# Patient Record
Sex: Female | Born: 1990 | Race: White | Hispanic: No | Marital: Single | Smoking: Never smoker
Health system: Southern US, Community
[De-identification: ages and names within clinical notes are randomized; demographics above are authoritative.]

## PROBLEM LIST (undated history)

## (undated) DIAGNOSIS — F419 Anxiety disorder, unspecified: Secondary | ICD-10-CM

## (undated) DIAGNOSIS — T7840XA Allergy, unspecified, initial encounter: Secondary | ICD-10-CM

## (undated) DIAGNOSIS — G7000 Myasthenia gravis without (acute) exacerbation: Secondary | ICD-10-CM

## (undated) DIAGNOSIS — D649 Anemia, unspecified: Secondary | ICD-10-CM

## (undated) HISTORY — PX: FRACTURE SURGERY: SHX138

## (undated) HISTORY — DX: Anemia, unspecified: D64.9

## (undated) HISTORY — DX: Allergy, unspecified, initial encounter: T78.40XA

## (undated) HISTORY — DX: Anxiety disorder, unspecified: F41.9

## (undated) HISTORY — DX: Myasthenia gravis without (acute) exacerbation: G70.00

---
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
## 2010-01-13 ENCOUNTER — Encounter: Payer: Self-pay | Admitting: Pediatrics

## 2010-01-13 ENCOUNTER — Emergency Department (HOSPITAL_COMMUNITY): Admission: EM | Admit: 2010-01-13 | Discharge: 2010-01-13 | Payer: Self-pay | Admitting: Emergency Medicine

## 2010-01-13 ENCOUNTER — Encounter (HOSPITAL_COMMUNITY)
Admission: RE | Admit: 2010-01-13 | Discharge: 2010-04-13 | Payer: Self-pay | Source: Home / Self Care | Admitting: Pediatrics

## 2010-01-18 ENCOUNTER — Emergency Department (HOSPITAL_COMMUNITY): Admission: EM | Admit: 2010-01-18 | Discharge: 2010-01-19 | Payer: Self-pay | Admitting: Emergency Medicine

## 2010-03-21 ENCOUNTER — Ambulatory Visit (HOSPITAL_COMMUNITY): Admission: RE | Admit: 2010-03-21 | Discharge: 2010-03-21 | Payer: Self-pay | Admitting: Pediatrics

## 2010-05-02 ENCOUNTER — Encounter (HOSPITAL_COMMUNITY)
Admission: RE | Admit: 2010-05-02 | Discharge: 2010-06-13 | Payer: Self-pay | Source: Home / Self Care | Attending: Pediatrics | Admitting: Pediatrics

## 2010-06-13 NOTE — Progress Notes (Signed)
Summary: Pt wants to know if it is ok to go hiking.  ---- Converted from flag ---- ---- 12/07/2009 3:44 PM, Marily Memos wrote: Pt wants to know if she can hike instead of running? Her Contact # [phone]. ------------------------------  Spoke with Wylene Men.  Advised pt per Dr. Darrick Penna that if she goes hiking to make sure she wears her ankle brace and not to over do it.  Pt. states understanding.  Terese Door, CMA

## 2010-06-13 NOTE — Consult Note (Signed)
Summary: [facility]  [facility]   Imported By: Marily Memos 12/27/2009 15:59:25  _____________________________________________________________________  External Attachment:    Type:   Image     Comment:   External Document

## 2010-06-13 NOTE — Assessment & Plan Note (Signed)
Summary: ANKLE INJURY/MJD   Vital Signs:  Patient profile:   21 year old female Height:      66 inches Weight:      160.50 pounds BMI:     26.00 Pulse rate:   71 / minute BP sitting:   118 / 78  (left arm)  Vitals Entered By: Terese Door (December 07, 2009 8:45 AM) CC: RIGHT ANKLE INJURY-TWISTED PLAYING SOCCER 1 WEEK AGO   CC:  RIGHT ANKLE INJURY-TWISTED PLAYING SOCCER 1 WEEK AGO.  History of Present Illness: 20 yo F 1 wk turned RT ankle playing soccer inverted swelled a lot for few days put on an old ankle brace 5 days ago and this helped some icing some  this ankle had been hurt last April as well We saw this on Dec 30 and found a small avulsion fx  this is 3rd injury in past 8 mos  Allergies: No Known Drug Allergies  Physical Exam  General:  Well-developed,well-nourished,in no acute distress; alert,appropriate and cooperative throughout examination Msk:  Left ankle shows no swelling; stable lateral and medial ligaments; squeeze test and kleiger test unremarkable; talar dome seems nontender; no sign of peroneal tendon subluxations; no pain at base of 5th MT. note there is generalized increased laxity   RT ankle shows mild lateral swelling; stable lateral and medial ligaments but some general laxity; squeeze test and kleiger test unremarkable; talar dome seems nontender; no sign of peroneal tendon subluxations; no pain at base of 5th MT. no TTP fibular head. There is mild direct TTP at distal tip of lat malleolus  Can walk/ walk on toes and heels and stand on 1 foot without too much pain or limping     Impression & Recommendations:  Problem # 1:  ANKLE PAIN, RIGHT (ICD-719.47) Pain is less w bracing encourage more icing keep rigid brace for 1 more week and then move to lace up  Problem # 2:  ANKLE SPRAIN, RIGHT (ICD-845.00) This is becoming a recurrent prob given ankle rehab exercises work these daily and progress them to better balance exercises  use lace  up braces for all running and sports for next year  reck 4 wks unless resolved sxs no running for 2 more wks but X Train is OK  Complete Medication List: 1)  Proventil Hfa 108 (90 Base) Mcg/act Aers (Albuterol sulfate) .Marland Kitchen.. 1 puff 20 min before exercise 2)  Singulair 10 Mg Tabs (Montelukast sodium) .Marland Kitchen.. 1 tab by mouth daily

## 2010-07-24 LAB — COMPREHENSIVE METABOLIC PANEL
ALT: 20 U/L (ref 0–35)
AST: 28 U/L (ref 0–37)
Alkaline Phosphatase: 36 U/L — ABNORMAL LOW (ref 39–117)
CO2: 23 mEq/L (ref 19–32)
GFR calc Af Amer: >60 mL/min (ref >60)
Glucose, Bld: 133 mg/dL — ABNORMAL HIGH (ref 70–99)
Potassium: 3.7 mEq/L (ref 3.5–5.1)
Sodium: 139 mEq/L (ref 135–145)
Total Protein: 6.9 g/dL (ref 6.0–8.3)

## 2010-07-24 LAB — DIFFERENTIAL
Basophils Relative: 0 % (ref 0–1)
Lymphs Abs: 0.8 10*3/uL (ref 0.7–4.0)
Monocytes Relative: 2 % — ABNORMAL LOW (ref 3–12)
Neutro Abs: 9.4 10*3/uL — ABNORMAL HIGH (ref 1.7–7.7)
Neutrophils Relative %: 91 % — ABNORMAL HIGH (ref 43–77)

## 2010-07-24 LAB — TYPE AND SCREEN: Antibody Screen: NEGATIVE

## 2010-07-24 LAB — PROTIME-INR: INR: 1.01 (ref 0.00–1.49)

## 2010-07-24 LAB — FIBRINOGEN: Fibrinogen: 208 mg/dL (ref 204–475)

## 2010-07-24 LAB — CBC
Hemoglobin: 11.5 g/dL — ABNORMAL LOW (ref 12.0–15.0)
RBC: 3.98 MIL/uL (ref 3.87–5.11)

## 2010-07-25 LAB — DIFFERENTIAL
Basophils Absolute: 0 10*3/uL (ref 0.0–0.1)
Eosinophils Absolute: 0 10*3/uL (ref 0.0–0.7)
Eosinophils Relative: 0 % (ref 0–5)
Lymphocytes Relative: 9 % — ABNORMAL LOW (ref 12–46)
Monocytes Absolute: 0.3 10*3/uL (ref 0.1–1.0)

## 2010-07-25 LAB — TYPE AND SCREEN: ABO/RH(D): A POS

## 2010-07-25 LAB — COMPREHENSIVE METABOLIC PANEL
ALT: 30 U/L (ref 0–35)
AST: 25 U/L (ref 0–37)
Albumin: 4 g/dL (ref 3.5–5.2)
Alkaline Phosphatase: 49 U/L (ref 39–117)
CO2: 23 mEq/L (ref 19–32)
Chloride: 105 mEq/L (ref 96–112)
GFR calc Af Amer: >60 mL/min (ref >60)
GFR calc non Af Amer: >60 mL/min (ref >60)
Potassium: 3.8 mEq/L (ref 3.5–5.1)
Sodium: 137 mEq/L (ref 135–145)
Total Bilirubin: 0.4 mg/dL (ref 0.3–1.2)

## 2010-07-25 LAB — CBC
Hemoglobin: 11.5 g/dL — ABNORMAL LOW (ref 12.0–15.0)
Hemoglobin: 12.4 g/dL (ref 12.0–15.0)
MCH: 30.4 pg (ref 26.0–34.0)
MCH: 30.9 pg (ref 26.0–34.0)
MCHC: 33.9 g/dL (ref 30.0–36.0)
Platelets: 206 10*3/uL (ref 150–400)
Platelets: 289 10*3/uL (ref 150–400)
RBC: 3.78 MIL/uL — ABNORMAL LOW (ref 3.87–5.11)
RDW: 12 % (ref 11.5–15.5)
WBC: 8.9 10*3/uL (ref 4.0–10.5)

## 2010-07-25 LAB — FIBRINOGEN: Fibrinogen: 237 mg/dL (ref 204–475)

## 2010-07-25 LAB — HCG, SERUM, QUALITATIVE: Preg, Serum: NEGATIVE

## 2010-07-25 LAB — APTT: aPTT: 28 seconds (ref 24–37)

## 2010-07-25 LAB — PROTIME-INR
INR: 0.98 (ref 0.00–1.49)
Prothrombin Time: 13.2 seconds (ref 11.6–15.2)

## 2010-07-27 LAB — COMPREHENSIVE METABOLIC PANEL
ALT: 29 U/L (ref 0–35)
ALT: 39 U/L — ABNORMAL HIGH (ref 0–35)
ALT: 52 U/L — ABNORMAL HIGH (ref 0–35)
AST: 29 U/L (ref 0–37)
AST: 38 U/L — ABNORMAL HIGH (ref 0–37)
AST: 53 U/L — ABNORMAL HIGH (ref 0–37)
Albumin: 4 g/dL (ref 3.5–5.2)
Albumin: 4.3 g/dL (ref 3.5–5.2)
Alkaline Phosphatase: 56 U/L (ref 39–117)
Alkaline Phosphatase: 34 U/L — ABNORMAL LOW (ref 39–117)
Alkaline Phosphatase: 36 U/L — ABNORMAL LOW (ref 39–117)
Alkaline Phosphatase: 42 U/L (ref 39–117)
BUN: 9 mg/dL (ref 6–23)
BUN: 10 mg/dL (ref 6–23)
BUN: 7 mg/dL (ref 6–23)
BUN: 8 mg/dL (ref 6–23)
BUN: 9 mg/dL (ref 6–23)
CO2: 26 mEq/L (ref 19–32)
CO2: 26 mEq/L (ref 19–32)
CO2: 26 mEq/L (ref 19–32)
Calcium: 9.2 mg/dL (ref 8.4–10.5)
Calcium: 9.3 mg/dL (ref 8.4–10.5)
Calcium: 9.5 mg/dL (ref 8.4–10.5)
Chloride: 106 mEq/L (ref 96–112)
Chloride: 104 mEq/L (ref 96–112)
Chloride: 107 mEq/L (ref 96–112)
Creatinine, Ser: 0.59 mg/dL (ref 0.4–1.2)
Creatinine, Ser: 0.51 mg/dL (ref 0.4–1.2)
Creatinine, Ser: 0.52 mg/dL (ref 0.4–1.2)
GFR calc Af Amer: >60 mL/min (ref >60)
GFR calc Af Amer: >60 mL/min (ref >60)
GFR calc Af Amer: >60 mL/min (ref >60)
GFR calc non Af Amer: >60 mL/min (ref >60)
GFR calc non Af Amer: >60 mL/min (ref >60)
GFR calc non Af Amer: >60 mL/min (ref >60)
Glucose, Bld: 100 mg/dL — ABNORMAL HIGH (ref 70–99)
Glucose, Bld: 105 mg/dL — ABNORMAL HIGH (ref 70–99)
Glucose, Bld: 98 mg/dL (ref 70–99)
Potassium: 3.8 mEq/L (ref 3.5–5.1)
Potassium: 3.5 mEq/L (ref 3.5–5.1)
Potassium: 3.6 mEq/L (ref 3.5–5.1)
Sodium: 137 mEq/L (ref 135–145)
Sodium: 139 mEq/L (ref 135–145)
Sodium: 139 mEq/L (ref 135–145)
Total Bilirubin: 0.4 mg/dL (ref 0.3–1.2)
Total Bilirubin: 0.8 mg/dL (ref 0.3–1.2)
Total Bilirubin: 1.1 mg/dL (ref 0.3–1.2)
Total Protein: 5.8 g/dL — ABNORMAL LOW (ref 6.0–8.3)
Total Protein: 5.9 g/dL — ABNORMAL LOW (ref 6.0–8.3)
Total Protein: 6.4 g/dL (ref 6.0–8.3)

## 2010-07-27 LAB — CBC
HCT: 31 % — ABNORMAL LOW (ref 36.0–46.0)
HCT: 33.1 % — ABNORMAL LOW (ref 36.0–46.0)
HCT: 33.1 % — ABNORMAL LOW (ref 36.0–46.0)
HCT: 32.7 % — ABNORMAL LOW (ref 36.0–46.0)
HCT: 35.5 % — ABNORMAL LOW (ref 36.0–46.0)
HCT: 36.2 % (ref 36.0–46.0)
HCT: 39.4 % (ref 36.0–46.0)
HCT: 40 % (ref 36.0–46.0)
Hemoglobin: 10.7 g/dL — ABNORMAL LOW (ref 12.0–15.0)
Hemoglobin: 11.2 g/dL — ABNORMAL LOW (ref 12.0–15.0)
Hemoglobin: 11.4 g/dL — ABNORMAL LOW (ref 12.0–15.0)
Hemoglobin: 11.5 g/dL — ABNORMAL LOW (ref 12.0–15.0)
Hemoglobin: 11.5 g/dL — ABNORMAL LOW (ref 12.0–15.0)
Hemoglobin: 11.3 g/dL — ABNORMAL LOW (ref 12.0–15.0)
Hemoglobin: 11.4 g/dL — ABNORMAL LOW (ref 12.0–15.0)
Hemoglobin: 14.2 g/dL (ref 12.0–15.0)
MCH: 33.4 pg (ref 26.0–34.0)
MCH: 33.6 pg (ref 26.0–34.0)
MCH: 33.2 pg (ref 26.0–34.0)
MCH: 33.4 pg (ref 26.0–34.0)
MCH: 34.1 pg — ABNORMAL HIGH (ref 26.0–34.0)
MCH: 34.2 pg — ABNORMAL HIGH (ref 26.0–34.0)
MCHC: 34.5 g/dL (ref 30.0–36.0)
MCHC: 34.5 g/dL (ref 30.0–36.0)
MCHC: 34.7 g/dL (ref 30.0–36.0)
MCHC: 34.9 g/dL (ref 30.0–36.0)
MCHC: 35.1 g/dL (ref 30.0–36.0)
MCHC: 35.2 g/dL (ref 30.0–36.0)
MCHC: 35.5 g/dL (ref 30.0–36.0)
MCV: 97 fL (ref 78.0–100.0)
MCV: 96.2 fL (ref 78.0–100.0)
MCV: 96.2 fL (ref 78.0–100.0)
MCV: 95.2 fL (ref 78.0–100.0)
MCV: 96.7 fL (ref 78.0–100.0)
MCV: 97.3 fL (ref 78.0–100.0)
MCV: 97.6 fL (ref 78.0–100.0)
Platelets: 168 10*3/uL (ref 150–400)
Platelets: 155 10*3/uL (ref 150–400)
Platelets: 169 10*3/uL (ref 150–400)
RBC: 3.18 MIL/uL — ABNORMAL LOW (ref 3.87–5.11)
RBC: 3.35 MIL/uL — ABNORMAL LOW (ref 3.87–5.11)
RBC: 3.44 MIL/uL — ABNORMAL LOW (ref 3.87–5.11)
RBC: 3.44 MIL/uL — ABNORMAL LOW (ref 3.87–5.11)
RBC: 3.46 MIL/uL — ABNORMAL LOW (ref 3.87–5.11)
RBC: 3.71 MIL/uL — ABNORMAL LOW (ref 3.87–5.11)
RBC: 4.14 MIL/uL (ref 3.87–5.11)
RDW: 12 % (ref 11.5–15.5)
RDW: 12 % (ref 11.5–15.5)
RDW: 12.1 % (ref 11.5–15.5)
RDW: 12.2 % (ref 11.5–15.5)
RDW: 12.2 % (ref 11.5–15.5)
WBC: 6.6 10*3/uL (ref 4.0–10.5)
WBC: 6.2 10*3/uL (ref 4.0–10.5)
WBC: 6.4 10*3/uL (ref 4.0–10.5)
WBC: 7.9 10*3/uL (ref 4.0–10.5)

## 2010-07-27 LAB — BASIC METABOLIC PANEL
BUN: 11 mg/dL (ref 6–23)
BUN: 7 mg/dL (ref 6–23)
CO2: 26 mEq/L (ref 19–32)
CO2: 28 mEq/L (ref 19–32)
CO2: 27 mEq/L (ref 19–32)
CO2: 26 mEq/L (ref 19–32)
Calcium: 9.2 mg/dL (ref 8.4–10.5)
Calcium: 9.2 mg/dL (ref 8.4–10.5)
Chloride: 106 mEq/L (ref 96–112)
Chloride: 107 mEq/L (ref 96–112)
Chloride: 106 mEq/L (ref 96–112)
Chloride: 110 mEq/L (ref 96–112)
Creatinine, Ser: 0.62 mg/dL (ref 0.4–1.2)
GFR calc Af Amer: >60 mL/min (ref >60)
GFR calc Af Amer: >60 mL/min (ref >60)
GFR calc non Af Amer: >60 mL/min (ref >60)
GFR calc non Af Amer: >60 mL/min (ref >60)
Glucose, Bld: 103 mg/dL — ABNORMAL HIGH (ref 70–99)
Glucose, Bld: 85 mg/dL (ref 70–99)
Glucose, Bld: 95 mg/dL (ref 70–99)
Glucose, Bld: 97 mg/dL (ref 70–99)
Glucose, Bld: 88 mg/dL (ref 70–99)
Potassium: 3.4 mEq/L — ABNORMAL LOW (ref 3.5–5.1)
Potassium: 3.8 mEq/L (ref 3.5–5.1)
Potassium: 3.7 mEq/L (ref 3.5–5.1)
Potassium: 4.4 mEq/L (ref 3.5–5.1)
Sodium: 138 mEq/L (ref 135–145)
Sodium: 139 mEq/L (ref 135–145)
Sodium: 139 mEq/L (ref 135–145)
Sodium: 141 mEq/L (ref 135–145)

## 2010-07-27 LAB — POCT I-STAT, CHEM 8
Calcium, Ion: 1.2 mmol/L (ref 1.12–1.32)
Glucose, Bld: 91 mg/dL (ref 70–99)
HCT: 37 % (ref 36.0–46.0)
Hemoglobin: 12.6 g/dL (ref 12.0–15.0)
Potassium: 3.7 mEq/L (ref 3.5–5.1)
TCO2: 22 mmol/L (ref 0–100)

## 2010-07-27 LAB — DIFFERENTIAL
Basophils Absolute: 0 10*3/uL (ref 0.0–0.1)
Basophils Absolute: 0 10*3/uL (ref 0.0–0.1)
Basophils Relative: 1 % (ref 0–1)
Basophils Relative: 1 % (ref 0–1)
Basophils Relative: 0 % (ref 0–1)
Eosinophils Absolute: 0.8 10*3/uL — ABNORMAL HIGH (ref 0.0–0.7)
Eosinophils Absolute: 0.3 10*3/uL (ref 0.0–0.7)
Eosinophils Relative: 11 % — ABNORMAL HIGH (ref 0–5)
Eosinophils Relative: 2 % (ref 0–5)
Lymphocytes Relative: 23 % (ref 12–46)
Lymphocytes Relative: 19 % (ref 12–46)
Lymphs Abs: 1.4 10*3/uL (ref 0.7–4.0)
Lymphs Abs: 1.2 10*3/uL (ref 0.7–4.0)
Monocytes Absolute: 0.5 10*3/uL (ref 0.1–1.0)
Monocytes Absolute: 0.6 10*3/uL (ref 0.1–1.0)
Monocytes Absolute: 0.4 10*3/uL (ref 0.1–1.0)
Monocytes Absolute: 0.8 10*3/uL (ref 0.1–1.0)
Monocytes Relative: 8 % (ref 3–12)
Monocytes Relative: 13 % — ABNORMAL HIGH (ref 3–12)
Neutrophils Relative %: 58 % (ref 43–77)
Neutrophils Relative %: 53 % (ref 43–77)

## 2010-07-27 LAB — ABO/RH: ABO/RH(D): A POS

## 2010-07-27 LAB — HEPATITIS C ANTIBODY: HCV Ab: NEGATIVE

## 2010-07-27 LAB — APTT: aPTT: 35 seconds (ref 24–37)

## 2010-07-27 LAB — RETICULOCYTES: Retic Ct Pct: 2.6 % (ref 0.4–3.1)

## 2010-07-27 LAB — PROTIME-INR: Prothrombin Time: 13.1 seconds (ref 11.6–15.2)

## 2010-07-30 ENCOUNTER — Encounter: Payer: Self-pay | Admitting: *Deleted

## 2012-04-20 IMAGING — CR DG CHEST 2V
2 series · 2 of 2 positions shown · non-contrast
Comparison: 05/28/2003.

CLINICAL DATA: Syncopal episode today.  Evaluate aeration.

CHEST - 2 VIEW

[w chest pa]
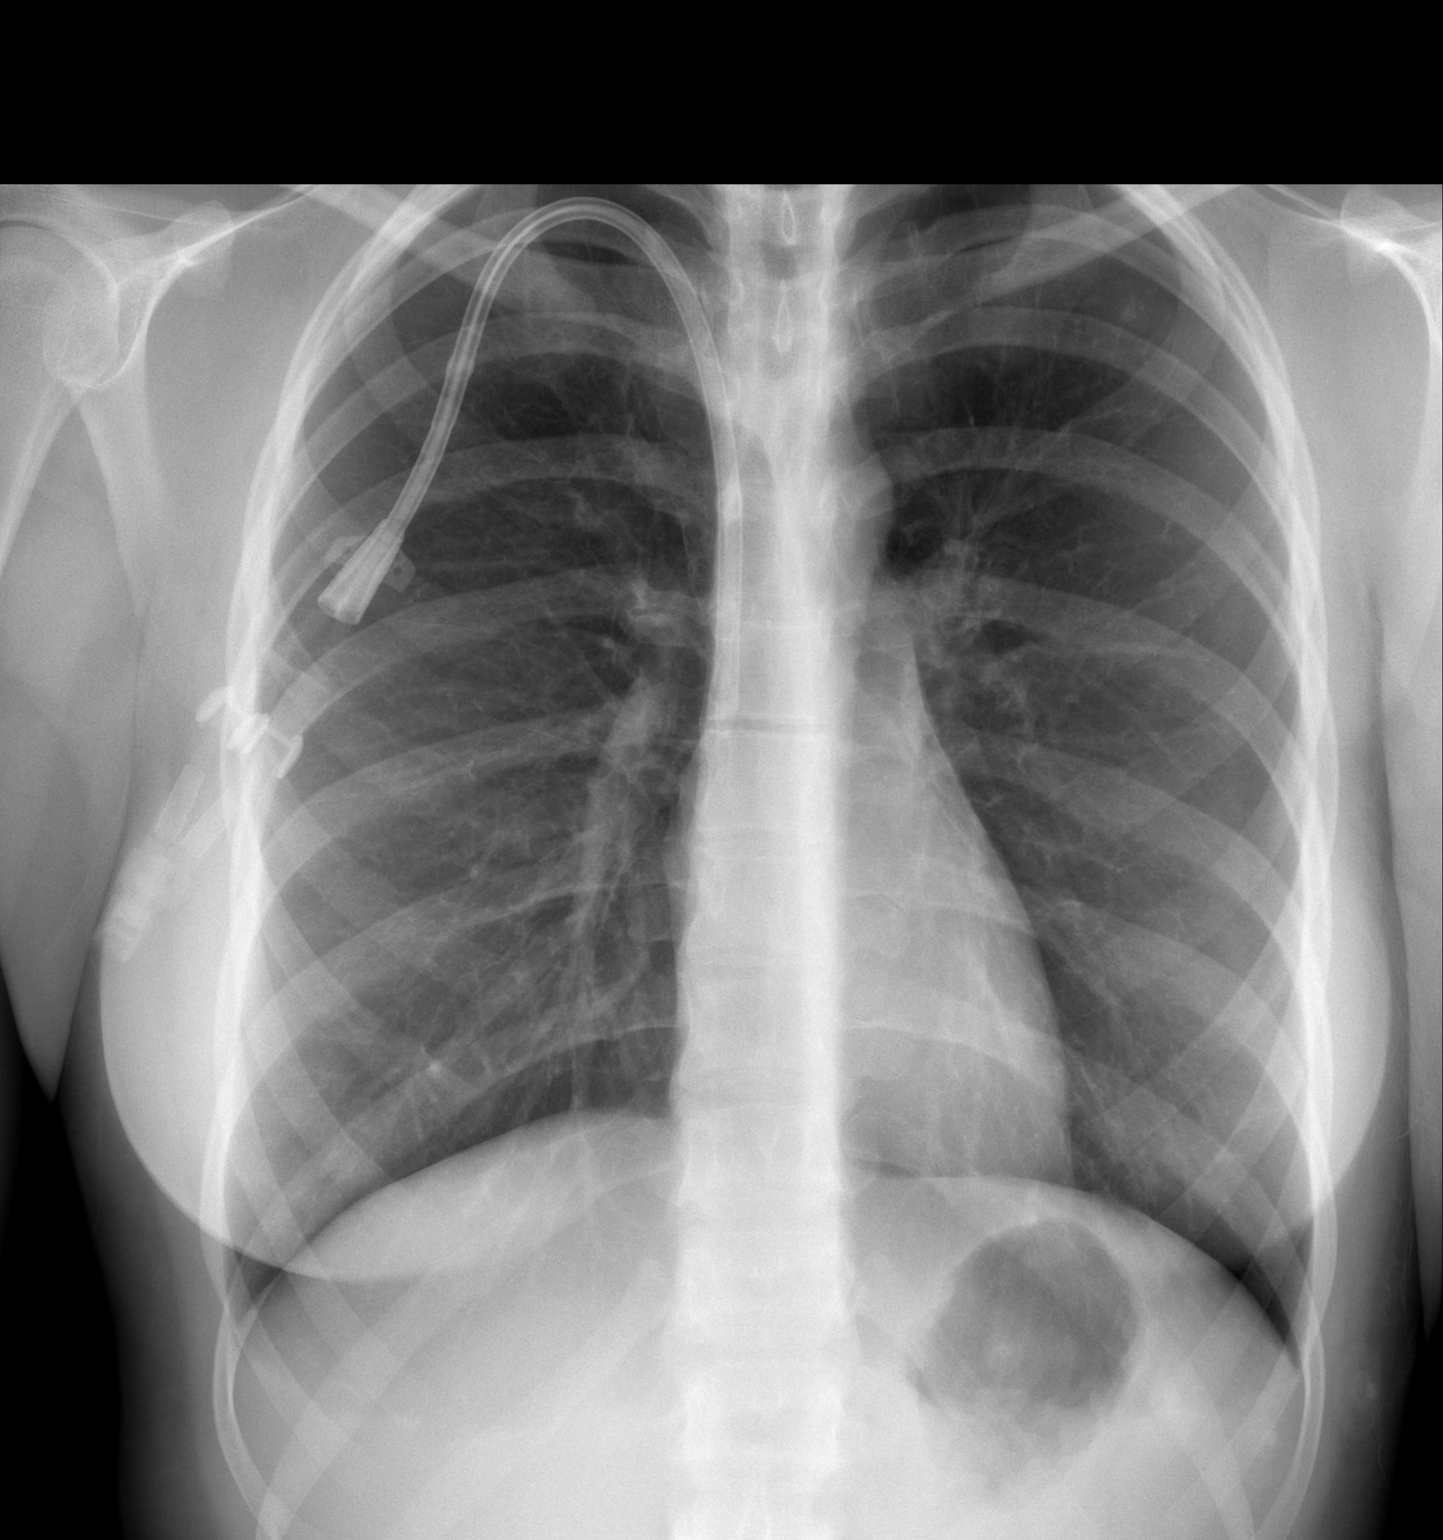

[w chest lat]
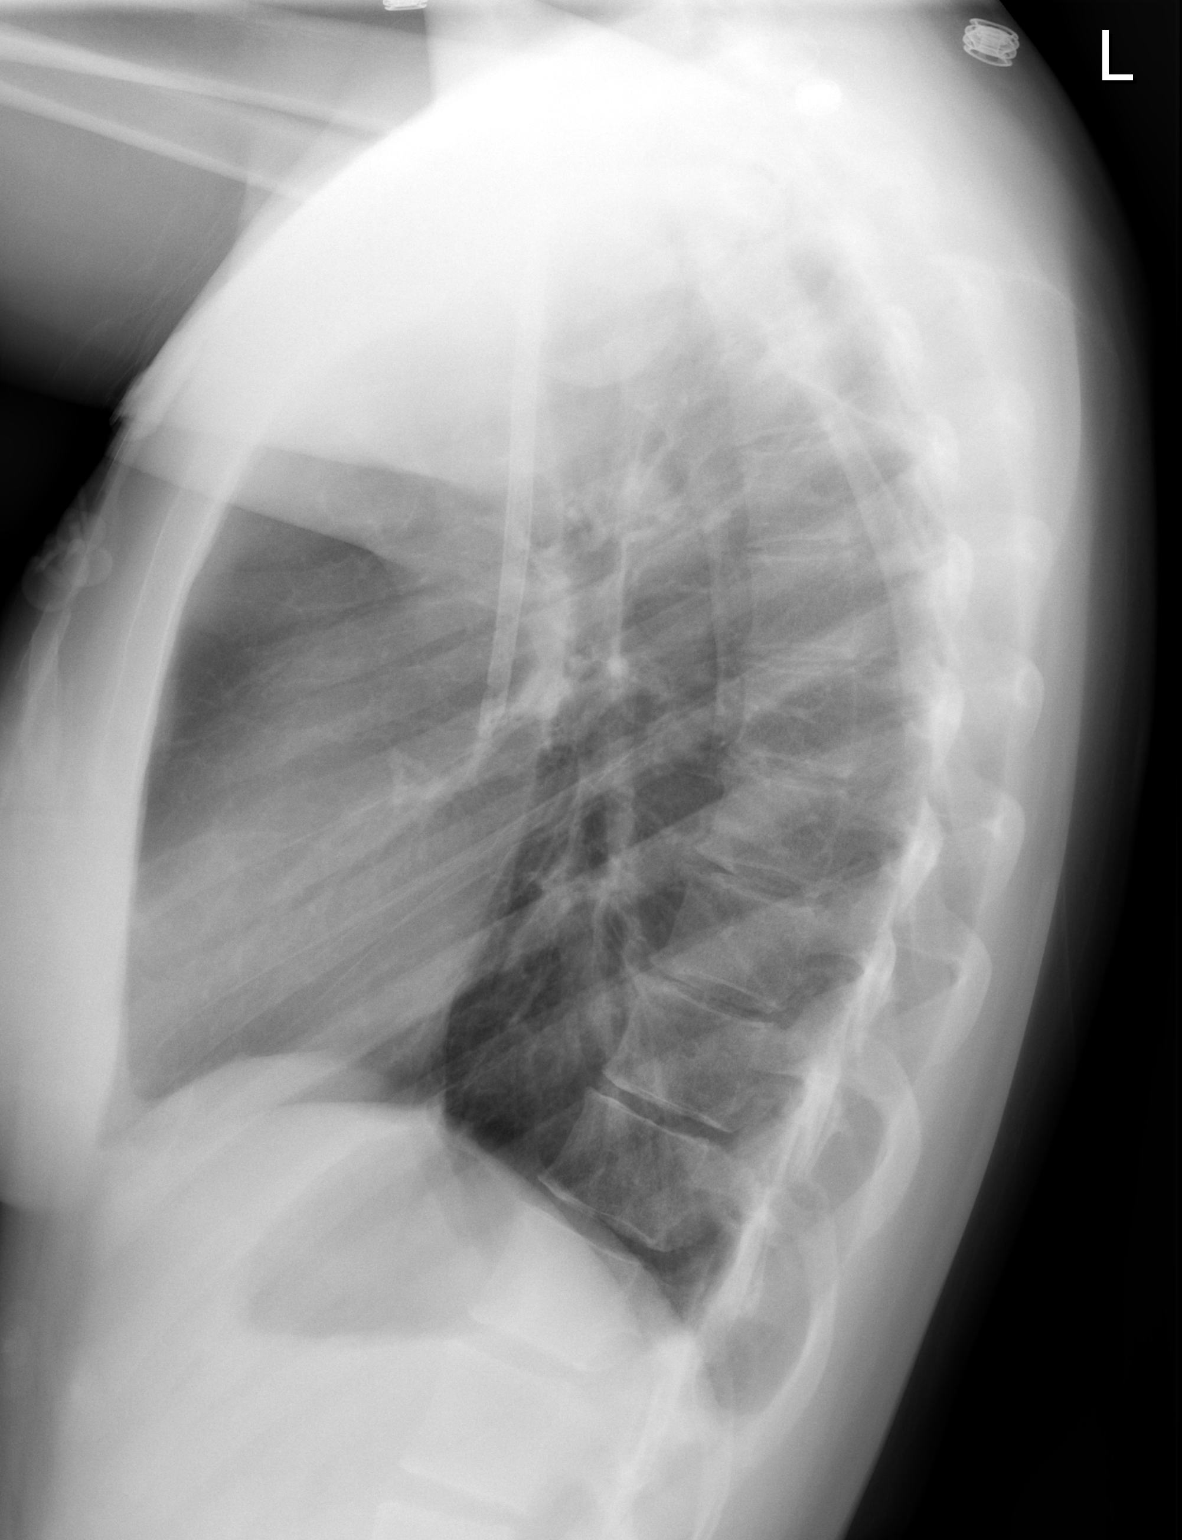

[2 of 2 positions shown; findings below may reference images not displayed]

FINDINGS: The lungs are clear.  The heart and mediastinal
structures are normal.  There is a central venous hemodialysis
catheters present with the tip of the catheter in the region of the
superior vena cava.
IMPRESSION: No evidence for active chest disease.

## 2012-04-20 IMAGING — XA IR FLUORO GUIDE CV LINE*R*
1 series · 2 of 2 positions shown · non-contrast
Comparison: none

CLINICAL DATA: Myasthenia gravis, access for plasma paresis

[Series 1: run · 2 of 2 slices shown]
[im 1/2]
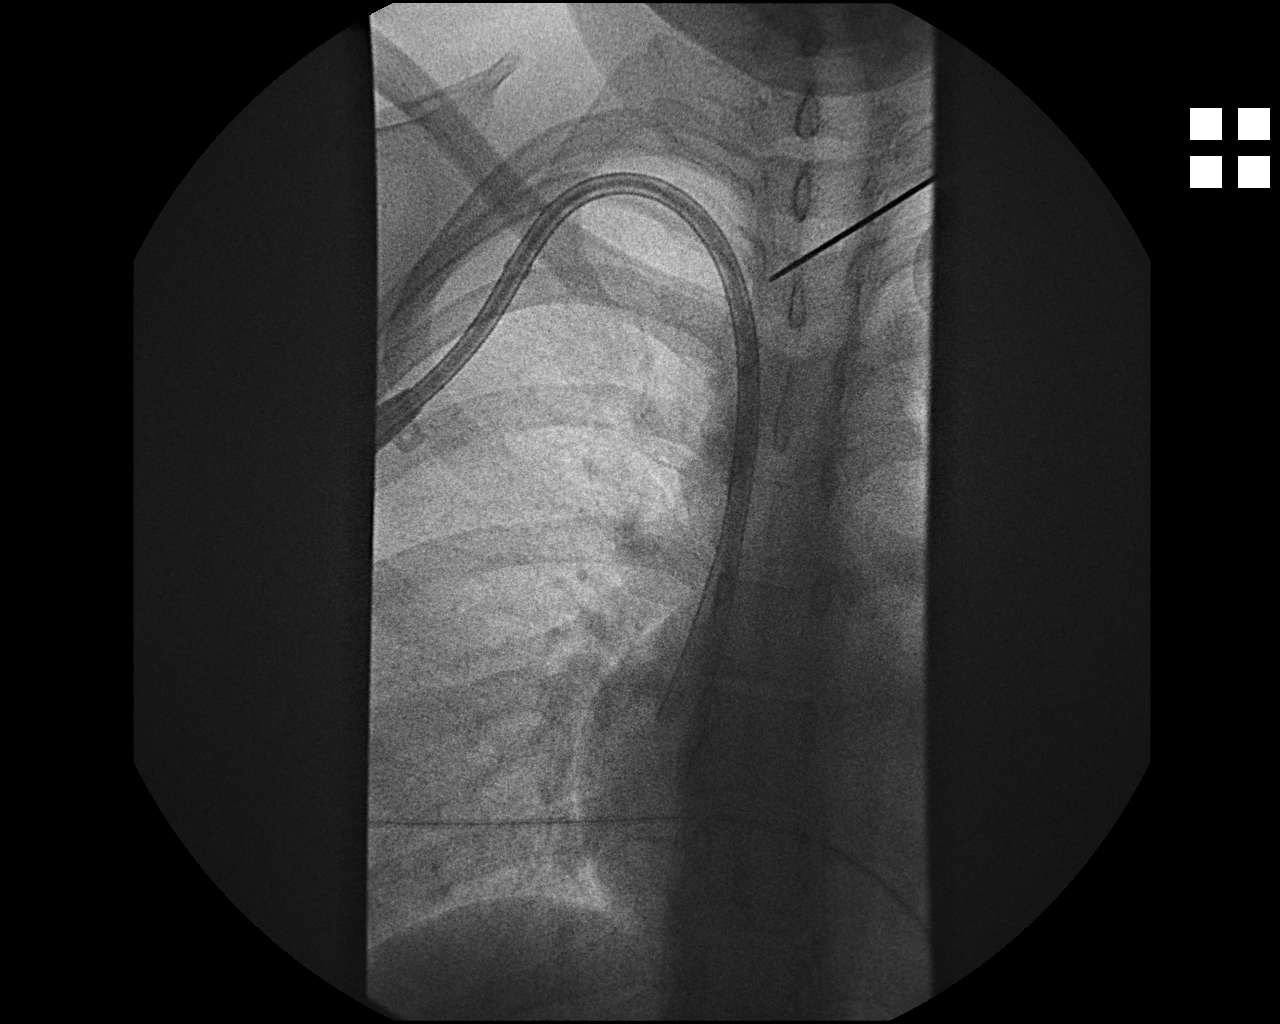
[im 2/2]
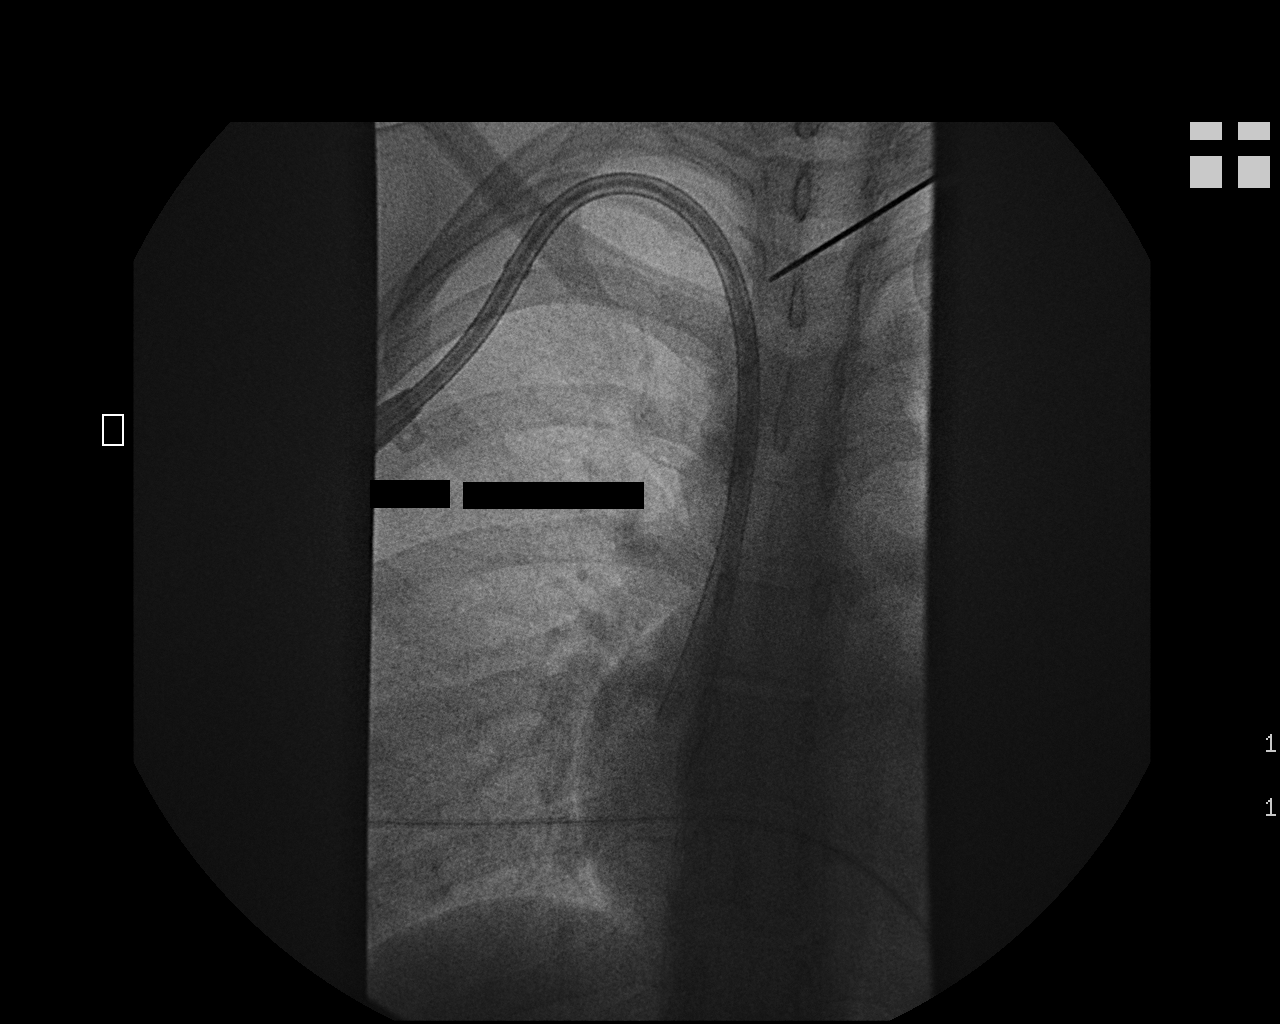

[2 of 2 positions shown; findings below may reference images not displayed]

ULTRASOUND GUIDANCE FOR VASCULAR ACCESS
RIGHT INTERNAL JUGULAR PERMANENT DIALYSIS/PHERESIS CATHETER

Date:  01/13/2010 [DATE]

Radiologist:  Hj Wahab Singgih, M.D.

Medications:  1 gram ancefadministered within 1 hour of the
procedure,2 mg Versed, 50 mcg Fentanyl

Guidance:  Ultrasound and fluoroscopic

Fluoroscopy time:  0.4 minutes

Sedation time:  20 minutes

Contrast volume:  None.

Complications:  No immediate

PROCEDURE/FINDINGS:

Informed consent was obtained from the patient following
explanation of the procedure, risks, benefits and alternatives.
The patient understands, agrees and consents for the procedure.
All questions were addressed.  A time out was performed.

Maximal barrier sterile technique utilized including caps, mask,
sterile gowns, sterile gloves, large sterile drape, hand hygiene,
and 2% chlorhexidine scrub.

Under sterile conditions and local anesthesia, right internal
jugular micropuncture venous access was performed with ultrasound.
Images were obtained for documentation.  A guide wire was inserted
followed by a transitional dilator.  Next, a 0.035 guidewire was
advanced into the IVC with a 5-French catheter.  Measurements were
obtained from the right venotomy site to the proximal right atrium.
In the right infraclavicular chest, a subcutaneous tunnel was
created under sterile conditions and local anesthesia.  1%
lidocaine with epinephrine was utilized for this.  The 19 cm tip to
cuff Equistream catheter was tunneled subcutaneously to the
venotomy site and inserted into the SVC/RA junction through a
valved peel-away sheath.  Position was confirmed with fluoroscopy.
Images were obtained for documentation.  Blood was aspirated from
the catheter followed by saline and heparin flushes.  The
appropriate volume and strength of heparin was instilled in each
lumen.  Caps were applied.  The catheter was secured at the tunnel
site with Gelfoam and a pursestring suture.  The venotomy site was
closed with subcuticular Vicryl suture.  Dermabond was applied to
the small right neck incision.  A dry sterile dressing was applied.
The catheter is ready for use.  No immediate complications.
IMPRESSION: Ultrasound and fluoroscopically guided right internal jugular
tunneled pheresis catheter (19 cm tip to cuff Equistream catheter).

## 2012-04-26 IMAGING — CR DG CHEST 2V
2 series · 2 of 2 positions shown · non-contrast
Comparison: Chest 01/13/2010.

CLINICAL DATA: Neck pain in patient with recent central line
placement.

CHEST - 2 VIEW

[w chest pa]
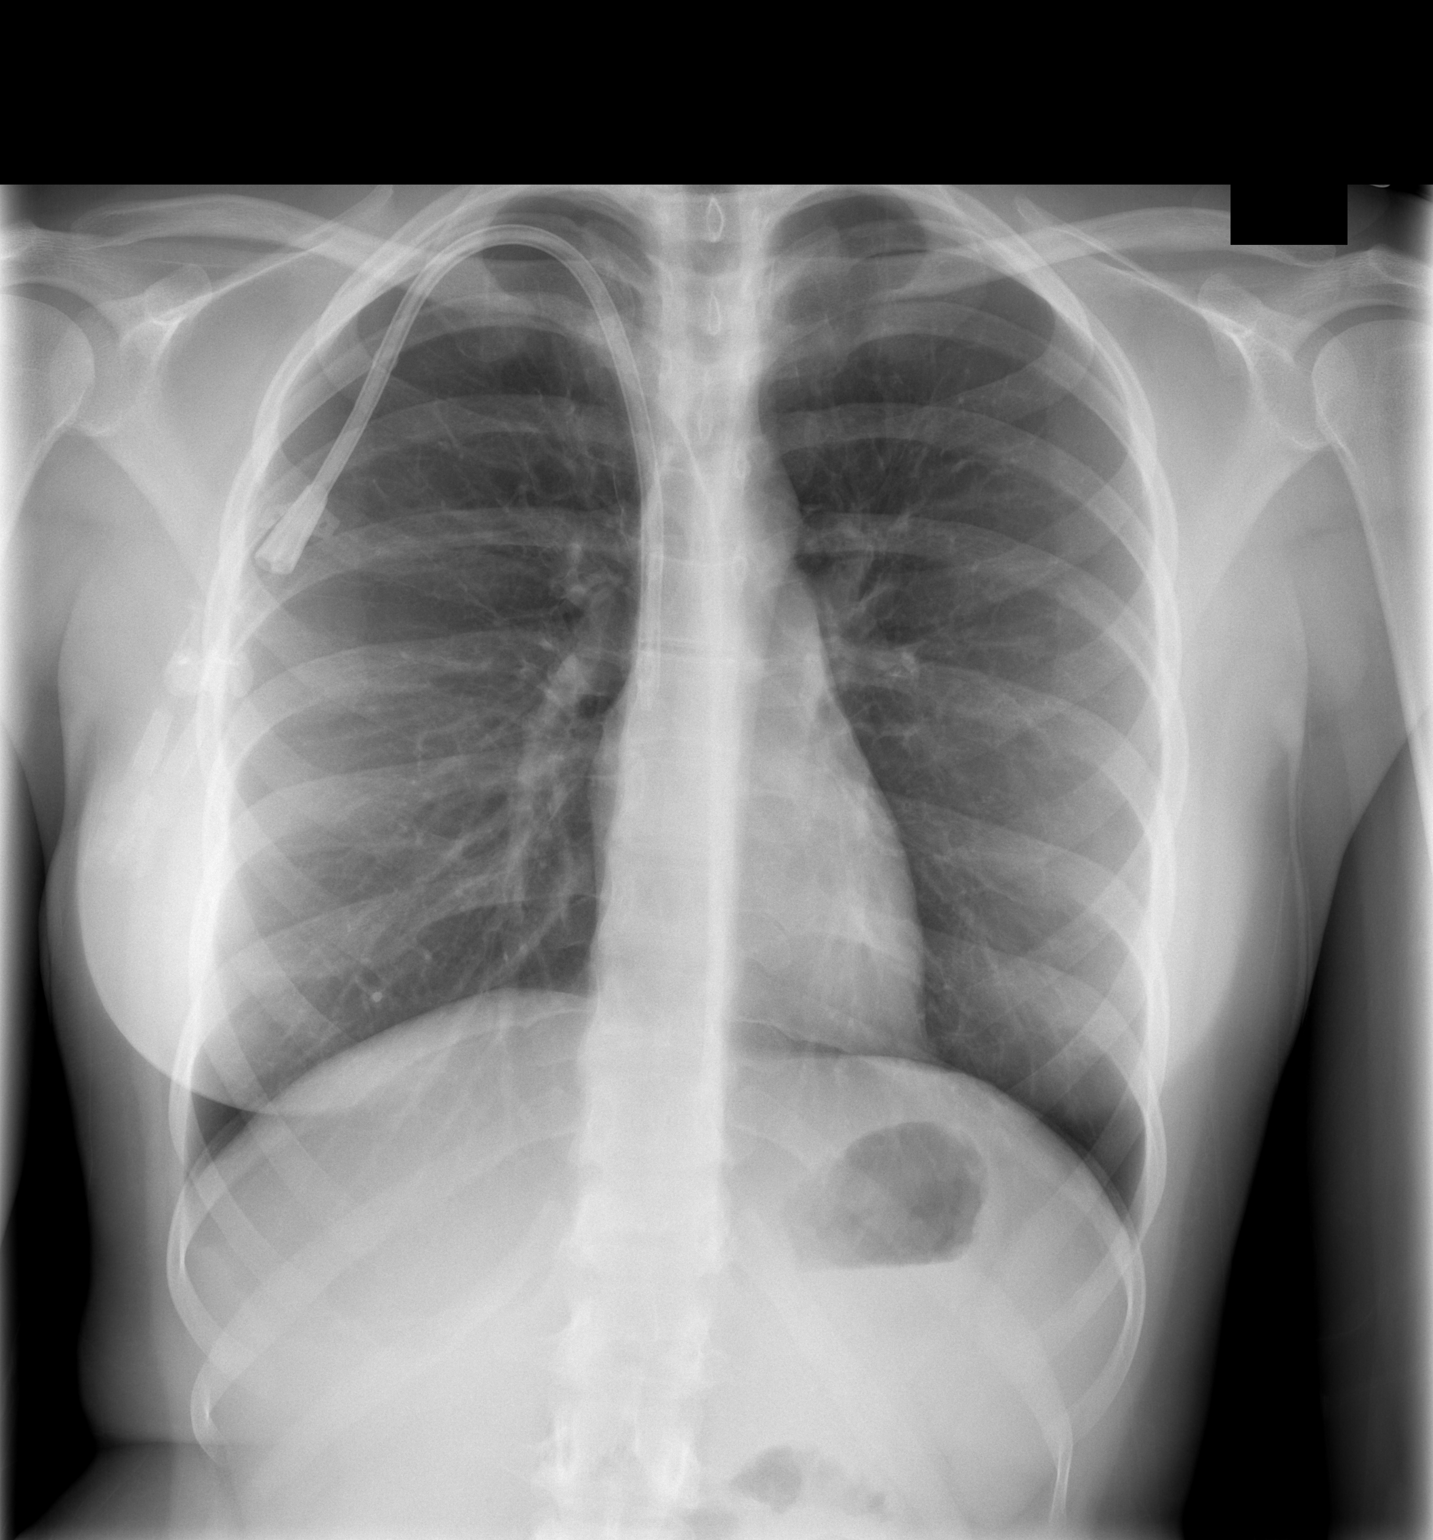

[w chest lat]
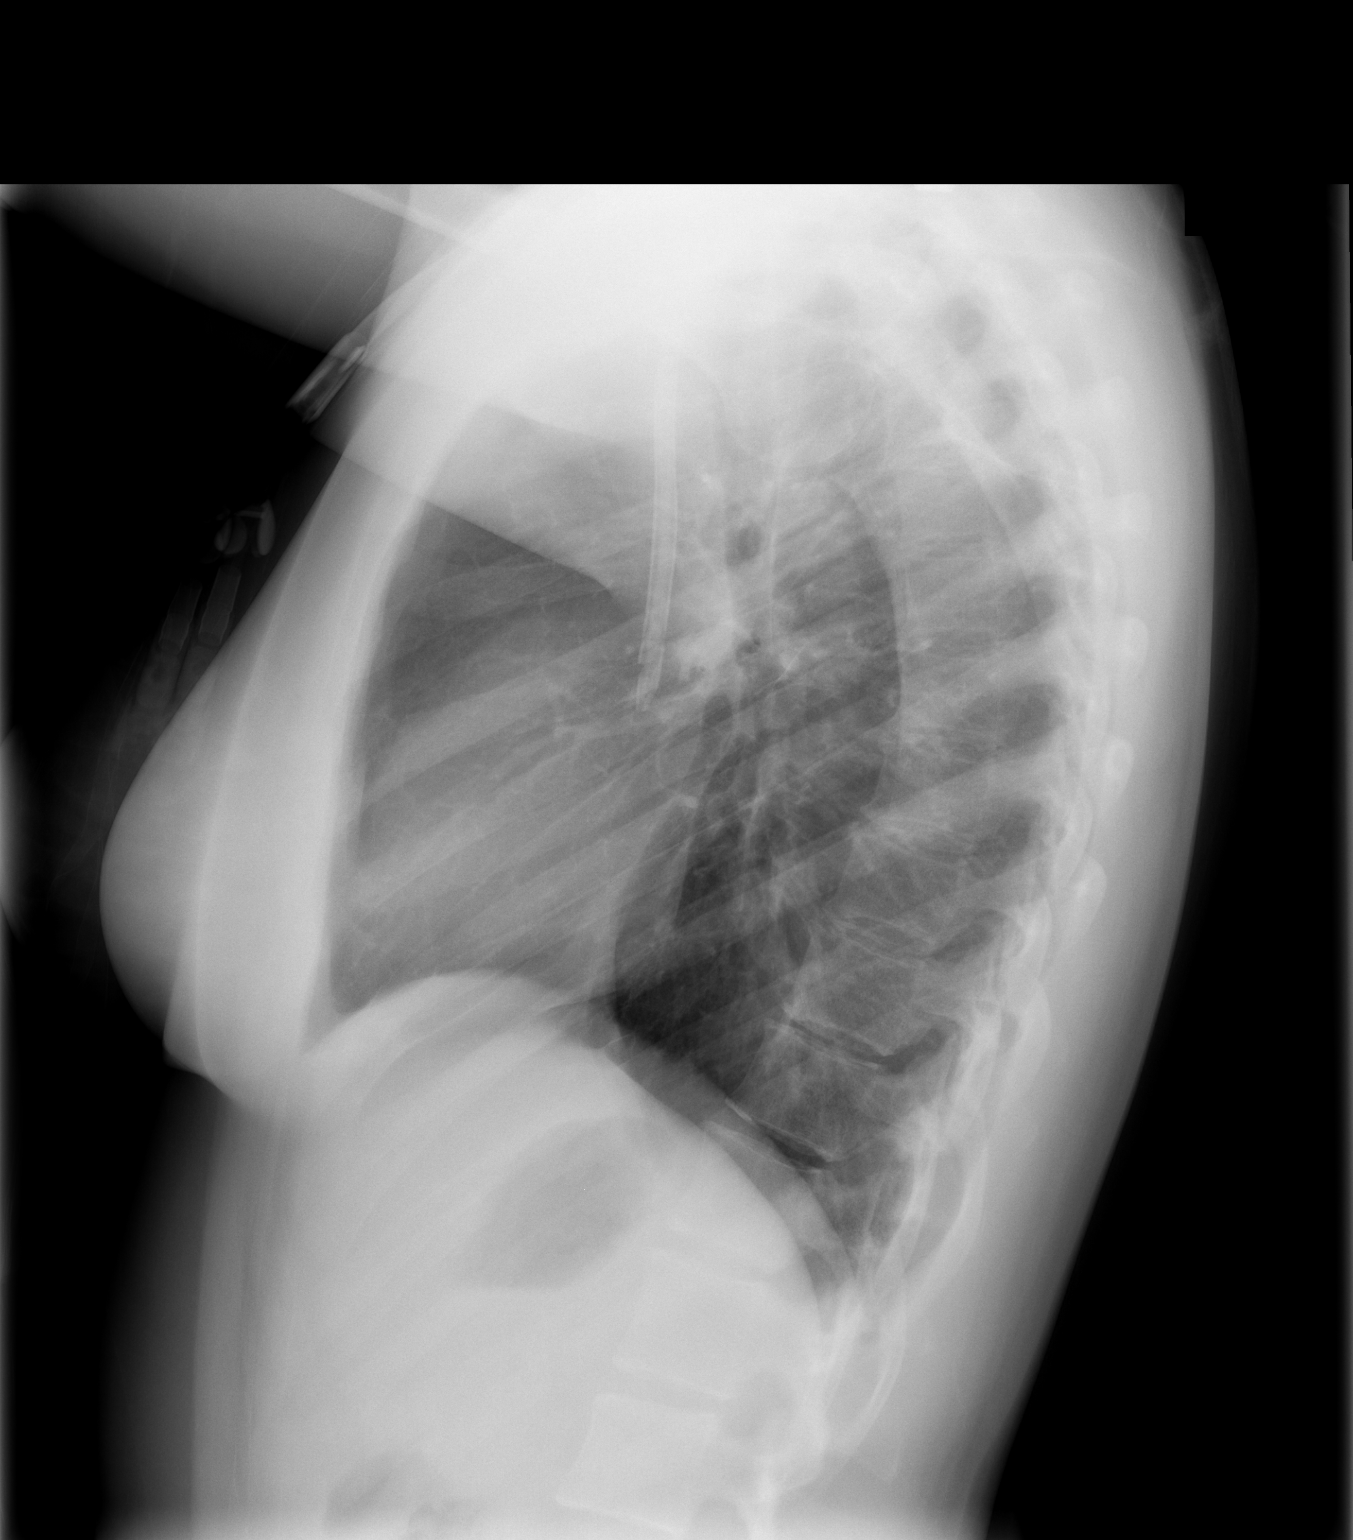

[2 of 2 positions shown; findings below may reference images not displayed]

FINDINGS: Double-lumen central venous catheter is in place with the
tip in the lower superior vena cava, unchanged.  There is no
pneumothorax.  The lungs are clear.  No effusion.  Heart size
normal.  No focal bony abnormality.
IMPRESSION: No acute finding.  Central line in good position without evidence
of complication.

## 2012-04-26 IMAGING — CT CT NECK W/ CM
5 series · 16 of 33 positions shown, 18 images · IV contrast (APPLIED)
Comparison: None.

CLINICAL DATA: Recent central line placement.  Pain about the
central line.

CT NECK WITH CONTRAST
TECHNIQUE: Multidetector CT imaging of the neck was performed with
intravenous contrast.
Contrast: 75 ml 6mnipaque-DHH.

[Series 3: st neck 2.0 b31s · axial · 0.47mm/px · z∈[-308,-128]mm · 4 of 152 slices shown, 5 images]
[im 31/152  soft-tissue]
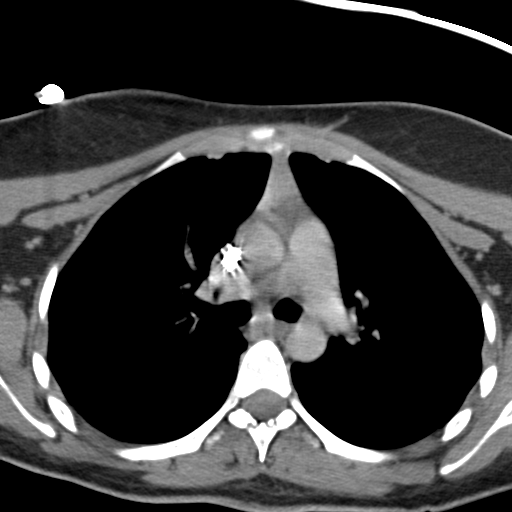
[im 31/152  bone]
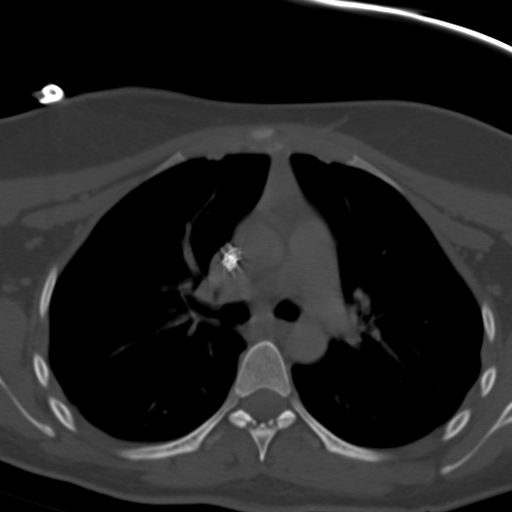
[im 61/152  bone]
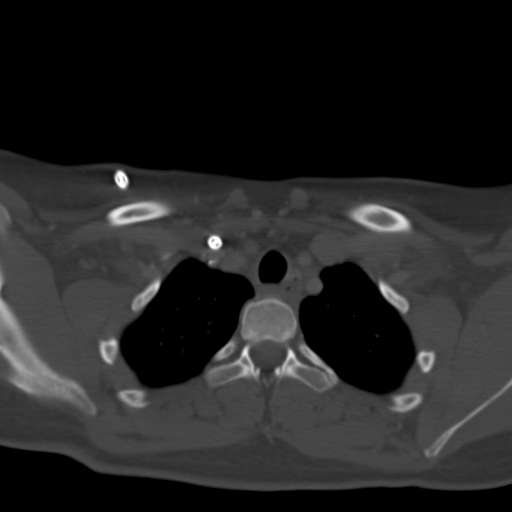
[im 91/152  bone]
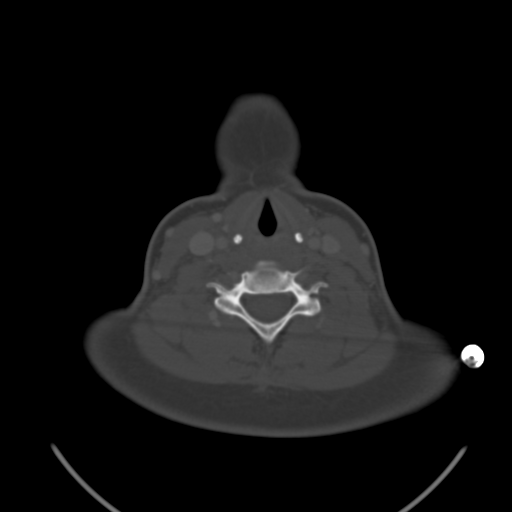
[im 121/152  bone]
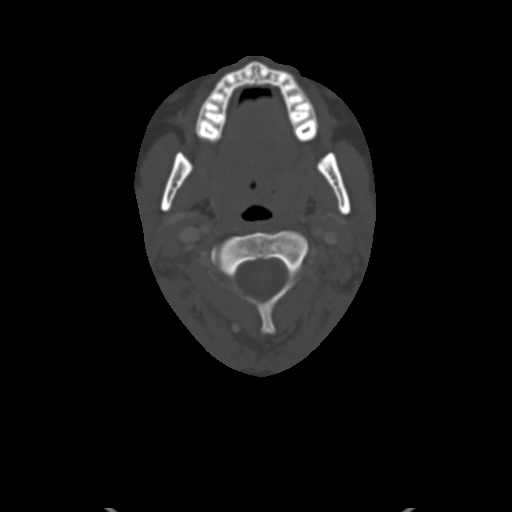

[Series 5: st neck 2.0 b60s · axial · 0.50mm/px · z∈[-313,-257]mm · 2 of 86 slices shown]
[im 29/86  bone]
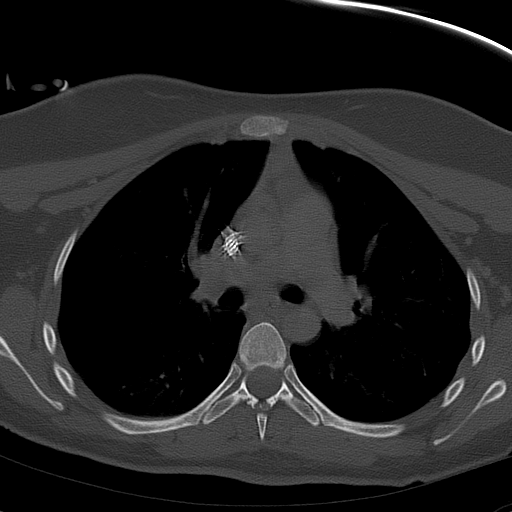
[im 57/86  bone]
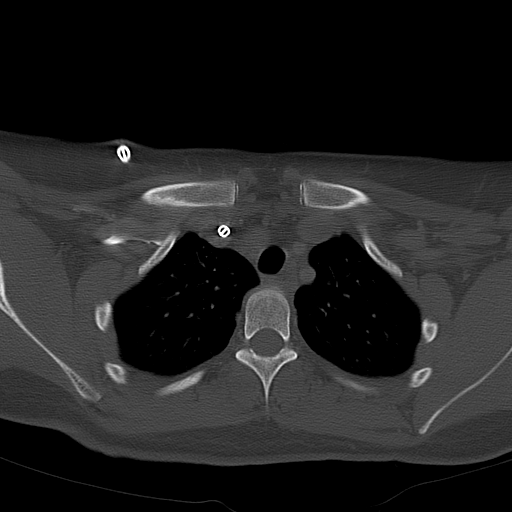

[Series 602: coronals · coronal · 0.59mm/px · 3 of 70 slices shown]
[im 14/70  bone]
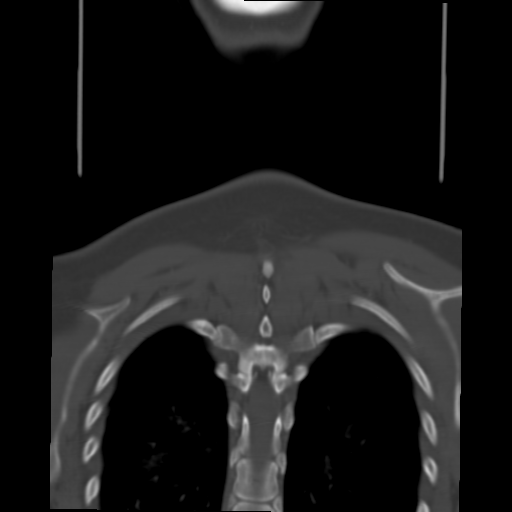
[im 28/70  bone]
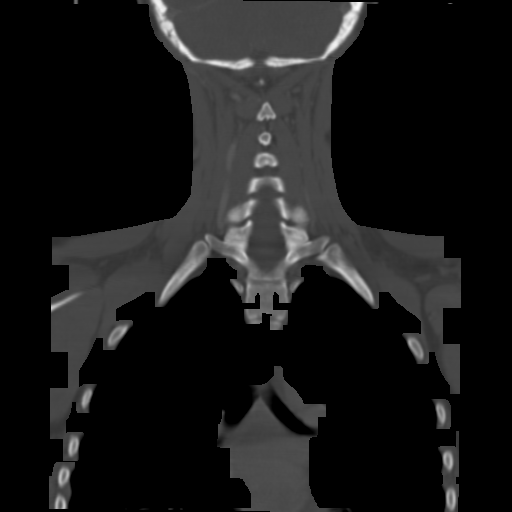
[im 42/70  bone]
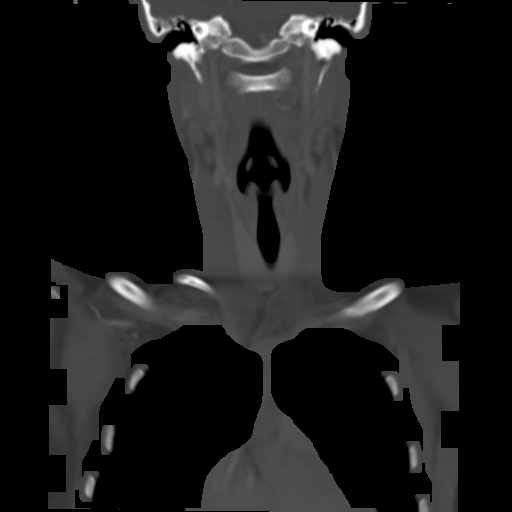

[Series 603: sagittals · sagittal · 0.59mm/px · 5 of 78 slices shown, 6 images]
[im 26/78  bone]
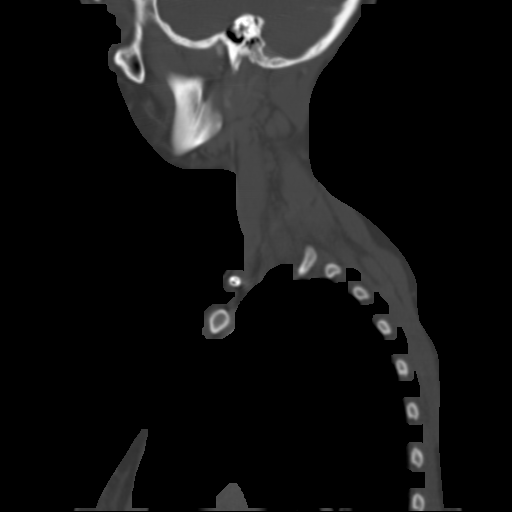
[im 33/78  bone]
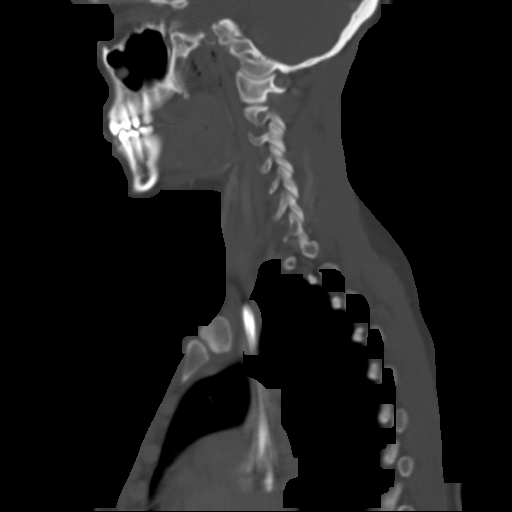
[im 39/78  soft-tissue]
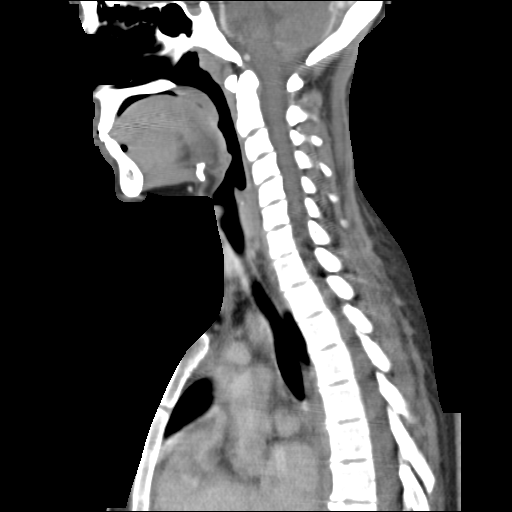
[im 39/78  bone]
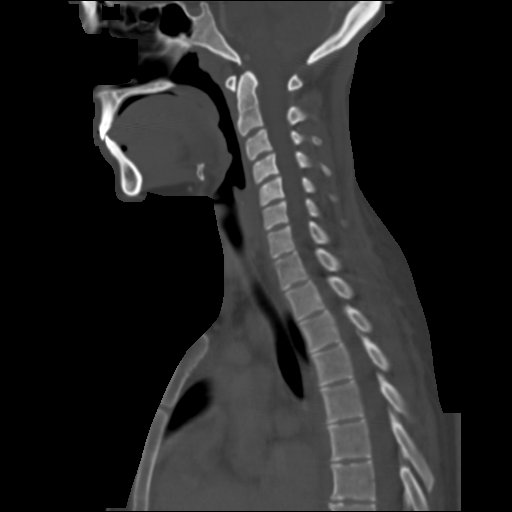
[im 45/78  bone]
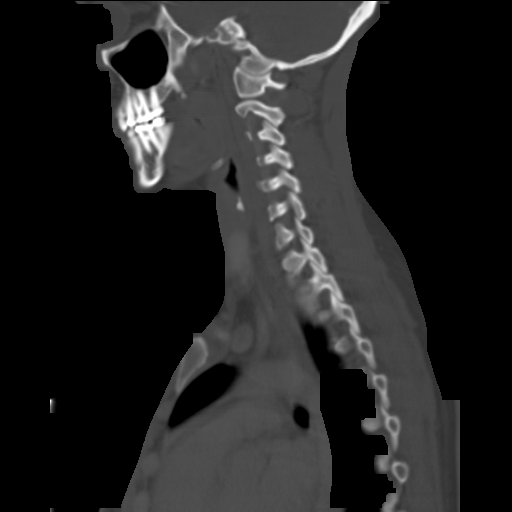
[im 52/78  bone]
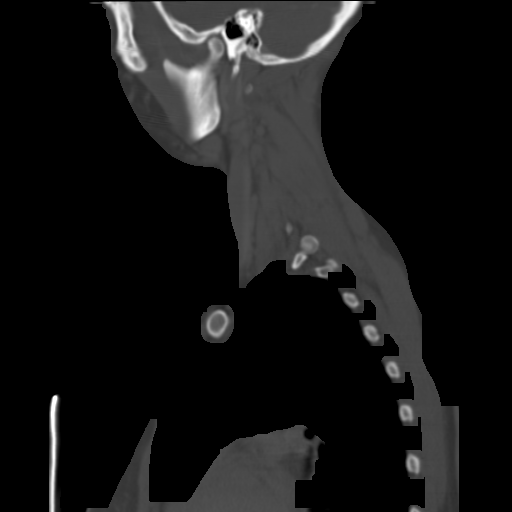

[Series 604: axials · axial · 0.59mm/px · z∈[-317,-216]mm · 2 of 106 slices shown]
[im 36/106  bone]
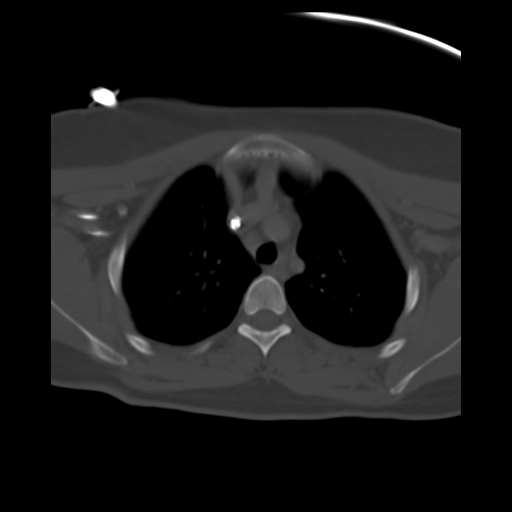
[im 71/106  bone]
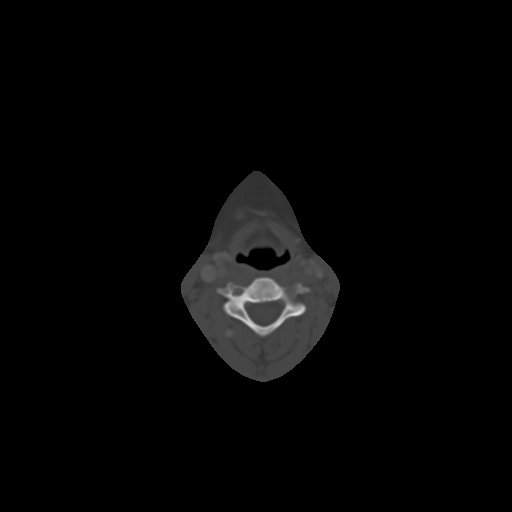

[16 of 33 positions shown; findings below may reference images not displayed]

FINDINGS: Right internal jugular central venous catheter is
identified.  The catheter is intact with the tip in good position
in the lower superior vena cava.  Catheter insertion site is well
seen.  No surrounding fluid collection or evidence of inflammatory
process.  No gas in the soft tissues identified.  No
lymphadenopathy.  Aerodigestive tract is unremarkable.  Imaged
intracranial contents appear normal.  Imaged lung parenchyma is
clear.  No focal bony abnormality.
IMPRESSION: Normal appearing right internal jugular central venous catheter.
Examination is otherwise unremarkable.

## 2013-01-05 ENCOUNTER — Ambulatory Visit (INDEPENDENT_AMBULATORY_CARE_PROVIDER_SITE_OTHER): Payer: BC Managed Care – PPO | Admitting: Internal Medicine

## 2013-01-05 ENCOUNTER — Ambulatory Visit: Payer: BC Managed Care – PPO

## 2013-01-05 VITALS — BP 118/72 | HR 58 | Temp 98.0°F | Resp 18 | Ht 66.5 in | Wt 185.0 lb

## 2013-01-05 DIAGNOSIS — R059 Cough, unspecified: Secondary | ICD-10-CM

## 2013-01-05 DIAGNOSIS — R05 Cough: Secondary | ICD-10-CM

## 2013-01-05 DIAGNOSIS — J45901 Unspecified asthma with (acute) exacerbation: Secondary | ICD-10-CM

## 2013-01-05 DIAGNOSIS — J309 Allergic rhinitis, unspecified: Secondary | ICD-10-CM

## 2013-01-05 DIAGNOSIS — R062 Wheezing: Secondary | ICD-10-CM

## 2013-01-05 DIAGNOSIS — N926 Irregular menstruation, unspecified: Secondary | ICD-10-CM

## 2013-01-05 LAB — POCT URINE PREGNANCY: Preg Test, Ur: NEGATIVE

## 2013-01-05 MED ORDER — IPRATROPIUM BROMIDE 0.02 % IN SOLN
0.5000 mg | Freq: Once | RESPIRATORY_TRACT | Status: AC
  Administered 2013-01-05: 0.5 mg via RESPIRATORY_TRACT

## 2013-01-05 MED ORDER — ALBUTEROL SULFATE (2.5 MG/3ML) 0.083% IN NEBU
2.5000 mg | INHALATION_SOLUTION | Freq: Once | RESPIRATORY_TRACT | Status: AC
  Administered 2013-01-05: 2.5 mg via RESPIRATORY_TRACT

## 2013-01-05 MED ORDER — MONTELUKAST SODIUM 10 MG PO TABS: 10.0000 mg | ORAL_TABLET | Freq: Every day | ORAL | Status: AC

## 2013-01-05 MED ORDER — PREDNISONE 20 MG PO TABS: ORAL_TABLET | ORAL | Status: DC

## 2013-01-05 NOTE — Progress Notes (Signed)
  Subjective:    Patient ID: Stefanie Barton, female    DOB: [birth year], 22 y.o.   MRN: [id number]  HPI 22 year old female presents with 1 week history of progressively worsening cough, SOB, wheezing, and fatigue.  States symptoms started as a mild asthma flare secondary to allergies but have progressively worsened and she so she has come for evaluation. Had pneumonia in 10/2012 with similar sx's so she is concerned.  Describes cough as mostly dry but sometimes productive of thin green sputum.  No fevers,chills, nausea, vomiting, otalgia, headache, sore throat, dizziness, hemoptysis, or chest pain. Does have tightness in her chest and has been using her albuterol inhaler twice daily over the last week. Typically her asthma is strictly exercise or allergy induced and she uses only prn. No daily inhaler.  Has been on singulair in the past but is not taking now.  Has been taking OTC Claritin.  Patient is otherwise healthy with no other concerns today.     Review of Systems  Constitutional: Negative for fever and chills.  HENT: Negative for ear pain, congestion, sore throat, rhinorrhea, postnasal drip and sinus pressure.   Respiratory: Positive for cough, chest tightness, shortness of breath and wheezing.   Cardiovascular: Negative for chest pain.  Gastrointestinal: Negative for nausea and vomiting.  Neurological: Negative for dizziness and headaches.       Objective:   Physical Exam  Constitutional: She is oriented to person, place, and time. She appears well-developed and well-nourished.  HENT:  Right Ear: Hearing, tympanic membrane, external ear and ear canal normal.  Left Ear: Hearing, tympanic membrane, external ear and ear canal normal.  Mouth/Throat: Uvula is midline, oropharynx is clear and moist and mucous membranes are normal.  Eyes: Conjunctivae are normal.  Neck: Normal range of motion. Neck supple.  Cardiovascular: Normal rate, regular rhythm and normal heart sounds.   Pulmonary/Chest:  Effort normal. She has wheezes.  Lymphadenopathy:    She has no cervical adenopathy.  Neurological: She is alert and oriented to person, place, and time.  Psychiatric: She has a normal mood and affect. Her behavior is normal. Judgment and thought content normal.    [facility] reading (PRIMARY) by  Dr. Merla Riches as no acute infiltrate or consolidation.      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 Refilled x 1 month. Ok to refill if needed Recommend OTC Zyrtec daily  Continue albuterol inhaler q4-6hours prn wheezing, SOB Follow up if symptoms worsen or fail to improve. Ok to send rx for Zpack if no better in 3-5 days   I have reviewed and agree with documentation. Robert P. Merla Riches, M.D.

## 2013-05-05 ENCOUNTER — Encounter: Payer: Self-pay | Admitting: Sports Medicine

## 2013-05-05 ENCOUNTER — Ambulatory Visit (INDEPENDENT_AMBULATORY_CARE_PROVIDER_SITE_OTHER): Payer: BC Managed Care – PPO | Admitting: Sports Medicine

## 2013-05-05 VITALS — BP 125/82 | Ht 66.0 in | Wt 170.0 lb

## 2013-05-05 DIAGNOSIS — M79609 Pain in unspecified limb: Secondary | ICD-10-CM

## 2013-05-05 DIAGNOSIS — R269 Unspecified abnormalities of gait and mobility: Secondary | ICD-10-CM

## 2013-05-05 NOTE — Assessment & Plan Note (Signed)
Stable at this time but has not returned to running

## 2013-05-05 NOTE — Assessment & Plan Note (Signed)
Patient was fitted for a : standard, cushioned, semi-rigid orthotic. The orthotic was heated and afterward the patient stood on the orthotic blank positioned on the orthotic stand. The patient was positioned in subtalar neutral position and 10 degrees of ankle dorsiflexion in a weight bearing stance. After completion of molding, a stable base was applied to the orthotic blank. The blank was ground to a stable position for weight bearing. Size: 8 red EVA Base: blue EVA Posting: first ray post and ist MTP pad Additional orthotic padding: heel pads  Running gait was nicely corrected in the orthotics and they felt comfortable  She was cautioned to ease back into running starting with walk runs  Preparation and evaluation time 45 minutes

## 2013-05-05 NOTE — Progress Notes (Signed)
Patient ID: Stefanie Barton, female   DOB: 1990-05-19, 22 y.o.   MRN: [id number]  Patient is a 22 year old female who has been in orthotics since her freshman year in high school because of recurrent problems with foot pain ankle pain and stress fractures. She was noted to have a first ray dominant foot. She had persistent splaying between her first and second rays. She had significant pes planus. And an abnormal gait with pronation that was corrected with the orthotics. With that approach she was able to continue running throughout high school and most of college.  She has been out of running for about one year and her older orthotics are no longer usable. She comes for evaluation and preparation of new orthotics.  Physical examination No acute distress BP 125/82  Ht 5\' 6"  (1.676 m)  Wt 170 lb (77.111 kg)  BMI 27.45 kg/m2  She has had a significant increase in her BMI Leg lengths are normal Splaying between toes 1 and 2 bilaterally Mild hallux varus Pes planus She has pronation on gait with no correction

## 2014-05-05 ENCOUNTER — Ambulatory Visit: Payer: BC Managed Care – PPO | Admitting: Sports Medicine

## 2015-05-09 ENCOUNTER — Ambulatory Visit (INDEPENDENT_AMBULATORY_CARE_PROVIDER_SITE_OTHER): Payer: BLUE CROSS/BLUE SHIELD | Admitting: Emergency Medicine

## 2015-05-09 VITALS — BP 146/86 | HR 82 | Temp 98.6°F | Resp 18 | Ht 66.5 in | Wt 219.6 lb

## 2015-05-09 DIAGNOSIS — J209 Acute bronchitis, unspecified: Secondary | ICD-10-CM | POA: Diagnosis not present

## 2015-05-09 MED ORDER — CLARITHROMYCIN 500 MG PO TABS: 500.0000 mg | ORAL_TABLET | Freq: Two times a day (BID) | ORAL | Status: AC

## 2015-05-09 MED ORDER — HYDROCOD POLST-CPM POLST ER 10-8 MG/5ML PO SUER: 5.0000 mL | Freq: Two times a day (BID) | ORAL | Status: AC

## 2015-05-09 NOTE — Progress Notes (Signed)
Subjective:  Patient ID: Stefanie Barton, female    DOB: 04/26/1991  Age: 24 y.o. MRN: [id number]  CC: Cough; Sore Throat; and Fatigue   HPI Stefanie Barton presents    with nasal congestion postnasal drip. He has a sore throat she has a cough productive purulent sputum. She has no wheezing or shortness breath. She has fatigue. She has no stool change rash or nausea or vomiting. Had no improvement with over-the-counter medication  History Stefanie Barton has a past medical history of Allergy; Anemia; Anxiety; and Myasthenia gravis (HCC).   She has past surgical history that includes Fracture surgery.   Her  family history includes Asthma in her maternal grandfather; Depression in her sister; Thyroid disease in her mother.  She   reports that she has never smoked. She has never used smokeless tobacco. She reports that she drinks about 2.5 oz of alcohol per week. She reports that she does not use illicit drugs.  Outpatient Prescriptions Prior to Visit  Medication Sig Dispense Refill  . albuterol (VENTOLIN HFA) 108 (90 BASE) MCG/ACT inhaler 1 puff 20 min before exercise     . montelukast (SINGULAIR) 10 MG tablet Take 1 tablet (10 mg total) by mouth daily. (Patient not taking: Reported on 05/09/2015) 30 tablet 1  . predniSONE (DELTASONE) 20 MG tablet Take 3 PO QAM x3days, 2 PO QAM x3days, 1 PO QAM x3days 18 tablet 0   No facility-administered medications prior to visit.    Social History   Social History  . Marital Status: Single    Spouse Name: N/A  . Number of Children: N/A  . Years of Education: N/A   Social History Main Topics  . Smoking status: Never Smoker   . Smokeless tobacco: Never Used  . Alcohol Use: 2.5 oz/week    5 drink(s) per week  . Drug Use: No  . Sexual Activity: Not Asked   Other Topics Concern  . None   Social History Narrative     Review of Systems  Constitutional: Positive for fatigue. Negative for fever, chills and appetite change.  HENT: Positive for  postnasal drip and sore throat. Negative for congestion, ear pain and sinus pressure.   Eyes: Negative for pain and redness.  Respiratory: Positive for cough. Negative for shortness of breath and wheezing.   Cardiovascular: Negative for leg swelling.  Gastrointestinal: Negative for nausea, vomiting, abdominal pain, diarrhea, constipation and blood in stool.  Endocrine: Negative for polyuria.  Genitourinary: Negative for dysuria, urgency, frequency and flank pain.  Musculoskeletal: Negative for gait problem.  Skin: Negative for rash.  Neurological: Negative for weakness and headaches.  Psychiatric/Behavioral: Negative for confusion and decreased concentration. The patient is not nervous/anxious.     Objective:  BP 146/86 mmHg  Pulse 82  Temp(Src) 98.6 F (37 C) (Oral)  Resp 18  Ht 5' 6.5" (1.689 m)  Wt 219 lb 9.6 oz (99.61 kg)  BMI 34.92 kg/m2  SpO2 97%  Physical Exam  Constitutional: She is oriented to person, place, and time. She appears well-developed and well-nourished. No distress.  HENT:  Head: Normocephalic and atraumatic.  Right Ear: External ear normal.  Left Ear: External ear normal.  Nose: Nose normal.  Eyes: Conjunctivae and EOM are normal. Pupils are equal, round, and reactive to light. No scleral icterus.  Neck: Normal range of motion. Neck supple. No tracheal deviation present.  Cardiovascular: Normal rate, regular rhythm and normal heart sounds.   Pulmonary/Chest: Effort normal. No respiratory distress. She has no  wheezes. She has no rales.  Abdominal: She exhibits no mass. There is no tenderness. There is no rebound and no guarding.  Musculoskeletal: She exhibits no edema.  Lymphadenopathy:    She has no cervical adenopathy.  Neurological: She is alert and oriented to person, place, and time. Coordination normal.  Skin: Skin is warm and dry. No rash noted.  Psychiatric: She has a normal mood and affect. Her behavior is normal.      Assessment & Plan:    Stefanie Barton was seen today for cough, sore throat and fatigue.  Diagnoses and all orders for this visit:  Acute bronchitis, unspecified organism  Other orders -     chlorpheniramine-HYDROcodone (TUSSIONEX PENNKINETIC ER) 10-8 MG/5ML SUER; Take 5 mLs by mouth 2 (two) times daily. -     clarithromycin (BIAXIN) 500 MG tablet; Take 1 tablet (500 mg total) by mouth 2 (two) times daily.  I have discontinued Ms. Brach's predniSONE. I am also having her start on chlorpheniramine-HYDROcodone and clarithromycin. Additionally, I am having her maintain her albuterol, montelukast, buPROPion, and escitalopram.  Meds ordered this encounter  Medications  . buPROPion (WELLBUTRIN SR) 100 MG 12 hr tablet    Sig: Take 100 mg by mouth 2 (two) times daily.  Marland Kitchen escitalopram (LEXAPRO) 20 MG tablet    Sig: Take 20 mg by mouth daily.  . chlorpheniramine-HYDROcodone (TUSSIONEX PENNKINETIC ER) 10-8 MG/5ML SUER    Sig: Take 5 mLs by mouth 2 (two) times daily.    Dispense:  60 mL    Refill:  0  . clarithromycin (BIAXIN) 500 MG tablet    Sig: Take 1 tablet (500 mg total) by mouth 2 (two) times daily.    Dispense:  20 tablet    Refill:  0    Appropriate red flag conditions were discussed with the patient as well as actions that should be taken.  Patient expressed his understanding.  Follow-up: Return if symptoms worsen or fail to improve.  Carmelina Dane, MD

## 2015-05-09 NOTE — Patient Instructions (Signed)
